# Patient Record
Sex: Female | Born: 1975 | Race: White | Hispanic: No | Marital: Married | Smoking: Never smoker
Health system: Southern US, Community
[De-identification: ages and names within clinical notes are randomized; demographics above are authoritative.]

---

## 2014-12-17 ENCOUNTER — Ambulatory Visit: Payer: Self-pay | Admitting: Obstetrics & Gynecology

## 2014-12-22 ENCOUNTER — Emergency Department: Payer: Self-pay | Admitting: Emergency Medicine

## 2014-12-30 ENCOUNTER — Emergency Department: Payer: Self-pay | Admitting: Emergency Medicine

## 2016-10-11 ENCOUNTER — Emergency Department

## 2016-10-11 ENCOUNTER — Emergency Department
Admission: EM | Admit: 2016-10-11 | Discharge: 2016-10-11 | Disposition: A | Discharge status: Home / Self Care | Attending: Emergency Medicine | Admitting: Emergency Medicine

## 2016-10-11 ENCOUNTER — Encounter: Payer: Self-pay | Admitting: Emergency Medicine

## 2016-10-11 DIAGNOSIS — R1110 Vomiting, unspecified: Secondary | ICD-10-CM | POA: Insufficient documentation

## 2016-10-11 DIAGNOSIS — R1011 Right upper quadrant pain: Secondary | ICD-10-CM | POA: Diagnosis not present

## 2016-10-11 LAB — URINALYSIS COMPLETE WITH MICROSCOPIC (ARMC ONLY)
Bacteria, UA: NONE SEEN
Bilirubin Urine: NEGATIVE
Glucose, UA: NEGATIVE mg/dL
Nitrite: NEGATIVE
PH: 5 (ref 5.0–8.0)
Protein, ur: 30 mg/dL — AB
SPECIFIC GRAVITY, URINE: 1.031 — AB (ref 1.005–1.030)

## 2016-10-11 LAB — COMPREHENSIVE METABOLIC PANEL
ALT: 16 U/L (ref 14–54)
ANION GAP: 9 (ref 5–15)
AST: 18 U/L (ref 15–41)
Albumin: 4 g/dL (ref 3.5–5.0)
Alkaline Phosphatase: 59 U/L (ref 38–126)
BUN: 11 mg/dL (ref 6–20)
CHLORIDE: 101 mmol/L (ref 101–111)
CO2: 26 mmol/L (ref 22–32)
Calcium: 8.8 mg/dL — ABNORMAL LOW (ref 8.9–10.3)
Creatinine, Ser: 0.61 mg/dL (ref 0.44–1.00)
GFR calc non Af Amer: >60 mL/min (ref >60)
Glucose, Bld: 144 mg/dL — ABNORMAL HIGH (ref 65–99)
POTASSIUM: 3.4 mmol/L — AB (ref 3.5–5.1)
SODIUM: 136 mmol/L (ref 135–145)
Total Bilirubin: 1 mg/dL (ref 0.3–1.2)
Total Protein: 7.4 g/dL (ref 6.5–8.1)

## 2016-10-11 LAB — CBC
HEMATOCRIT: 34.8 % — AB (ref 35.0–47.0)
HEMOGLOBIN: 12 g/dL (ref 12.0–16.0)
MCH: 30.6 pg (ref 26.0–34.0)
MCHC: 34.4 g/dL (ref 32.0–36.0)
MCV: 88.7 fL (ref 80.0–100.0)
Platelets: 230 10*3/uL (ref 150–440)
RBC: 3.92 MIL/uL (ref 3.80–5.20)
RDW: 12.9 % (ref 11.5–14.5)
WBC: 11.1 10*3/uL — ABNORMAL HIGH (ref 3.6–11.0)

## 2016-10-11 LAB — POCT PREGNANCY, URINE: PREG TEST UR: NEGATIVE

## 2016-10-11 LAB — LIPASE, BLOOD: Lipase: 33 U/L (ref 11–51)

## 2016-10-11 MED ORDER — ONDANSETRON 4 MG PO TBDP
ORAL_TABLET | ORAL | Status: AC
  Administered 2016-10-11: 4 mg via ORAL
  Filled 2016-10-11: qty 1

## 2016-10-11 MED ORDER — ONDANSETRON 4 MG PO TBDP: 4.0000 mg | ORAL_TABLET | Freq: Three times a day (TID) | ORAL | 0 refills | Status: AC | PRN

## 2016-10-11 MED ORDER — SODIUM CHLORIDE 0.9 % IV BOLUS (SEPSIS)
1000.0000 mL | Freq: Once | INTRAVENOUS | Status: AC
  Administered 2016-10-11: 1000 mL via INTRAVENOUS

## 2016-10-11 MED ORDER — MORPHINE SULFATE (PF) 2 MG/ML IV SOLN
2.0000 mg | Freq: Once | INTRAVENOUS | Status: DC
  Filled 2016-10-11: qty 1

## 2016-10-11 MED ORDER — IOPAMIDOL (ISOVUE-300) INJECTION 61%
100.0000 mL | Freq: Once | INTRAVENOUS | Status: AC | PRN
Start: 2016-10-11 — End: 2016-10-11
  Administered 2016-10-11: 100 mL via INTRAVENOUS

## 2016-10-11 MED ORDER — LOPERAMIDE HCL 2 MG PO TABS: 4.0000 mg | ORAL_TABLET | Freq: Four times a day (QID) | ORAL | 0 refills | Status: AC | PRN

## 2016-10-11 MED ORDER — ONDANSETRON 4 MG PO TBDP
4.0000 mg | ORAL_TABLET | Freq: Once | ORAL | Status: AC
  Administered 2016-10-11: 4 mg via ORAL

## 2016-10-11 MED ORDER — ONDANSETRON HCL 4 MG/2ML IJ SOLN
4.0000 mg | Freq: Once | INTRAMUSCULAR | Status: AC
  Administered 2016-10-11: 4 mg via INTRAVENOUS
  Filled 2016-10-11: qty 2

## 2016-10-11 MED ORDER — IOPAMIDOL (ISOVUE-300) INJECTION 61%
30.0000 mL | Freq: Once | INTRAVENOUS | Status: AC | PRN
  Administered 2016-10-11: 30 mL via ORAL

## 2016-10-11 NOTE — ED Provider Notes (Signed)
Patient feeling better. Tolerating oral intake. Exam benign. Vital signs unremarkable. CT negative. Follow-up primary care. Imodium and Zofran.   Sharman CheekPhillip Vianka Ertel, MD 10/11/16 [phone]

## 2016-10-11 NOTE — ED Provider Notes (Signed)
[facility] Emergency Department Provider Note    First MD Initiated Contact with Patient 10/11/16 [phone]     (approximate)  I have reviewed the triage vital signs and the nursing notes.   HISTORY  Chief Complaint Emesis    HPI Shelly HarmsLaura A Rotondo is a 40 y.o. female presents to emergency department with generalized abdominal pain and vomiting since yesterday. Patient admits to multiple episodes of nonbloody emesis. Patient denies any fever or febrile presentation with a temperature 98.6.   Past medical history None There are no active problems to display for this patient.   Past surgical history None  Prior to Admission medications   Medication Sig Start Date End Date Taking? Authorizing Provider  acetaminophen (TYLENOL) 325 MG tablet Take 650 mg by mouth every 6 (six) hours as needed.   Yes Historical Provider, MD  ibuprofen (ADVIL,MOTRIN) 200 MG tablet Take 200 mg by mouth every 6 (six) hours as needed.   Yes Historical Provider, MD  loratadine (CLARITIN) 10 MG tablet Take 10 mg by mouth daily.   Yes Historical Provider, MD    Allergies No known Drug allergies No family history on file.  Social History Social History  Substance Use Topics  . Smoking status: Never Smoker  . Smokeless tobacco: Never Used  . Alcohol use Not on file    Review of Systems Constitutional: No fever/chills Eyes: No visual changes. ENT: No sore throat. Cardiovascular: Denies chest pain. Respiratory: Denies shortness of breath. Gastrointestinal: Positive for vomiting and abdominal pain  Genitourinary: Negative for dysuria. Musculoskeletal: Negative for back pain. Skin: Negative for rash. Neurological: Negative for headaches, focal weakness or numbness.  10-point ROS otherwise negative.  ____________________________________________   PHYSICAL EXAM:  VITAL SIGNS: ED Triage Vitals  Enc Vitals Group     BP 10/11/16 0157 (!) 142/100     Pulse Rate 10/11/16  0157 (!) 103     Resp 10/11/16 0157 20     Temp 10/11/16 0157 98.6 F (37 C)     Temp Source 10/11/16 0157 Oral     SpO2 10/11/16 0157 100 %     Weight 10/11/16 0156 142 lb (64.4 kg)     Height 10/11/16 0156 5\' 8"  (1.727 m)     Head Circumference --      Peak Flow --      Pain Score --      Pain Loc --      Pain Edu? --      Excl. in GC? --     Constitutional: Alert and oriented. Well appearing and in no acute distress. Eyes: Conjunctivae are normal. PERRL. EOMI. Head: Atraumatic. Ears:  Healthy appearing ear canals and TMs bilaterally Nose: No congestion/rhinnorhea. Mouth/Throat: Mucous membranes are moist.  Oropharynx non-erythematous. Neck: No stridor.  No cervical spine tenderness to palpation. Cardiovascular: Normal rate, regular rhythm. Good peripheral circulation. Grossly normal heart sounds. Respiratory: Normal respiratory effort.  No retractions. Lungs CTAB. Gastrointestinal:Generalized tenderness to palpation worse in the right upper quadrant No distention.  Musculoskeletal: No lower extremity tenderness nor edema. No gross deformities of extremities. Neurologic:  Normal speech and language. No gross focal neurologic deficits are appreciated.  Skin:  Skin is warm, dry and intact. No rash noted. Psychiatric: Mood and affect are normal. Speech and behavior are normal.  ____________________________________________   LABS (all labs ordered are listed, but only abnormal results are displayed)  Labs Reviewed  COMPREHENSIVE METABOLIC PANEL - Abnormal; Notable for the following:  Result Value   Potassium 3.4 (*)    Glucose, Bld 144 (*)    Calcium 8.8 (*)    All other components within normal limits  CBC - Abnormal; Notable for the following:    WBC 11.1 (*)    HCT 34.8 (*)    All other components within normal limits  URINALYSIS COMPLETEWITH MICROSCOPIC ([facility] ONLY) - Abnormal; Notable for the following:    Color, Urine YELLOW (*)    APPearance HAZY (*)     Ketones, ur TRACE (*)    Specific Gravity, Urine 1.031 (*)    Hgb urine dipstick 1+ (*)    Protein, ur 30 (*)    Leukocytes, UA TRACE (*)    Squamous Epithelial / LPF 6-30 (*)    All other components within normal limits  LIPASE, BLOOD  POCT PREGNANCY, URINE    RADIOLOGY I, Lucan N Denetta Fei, personally viewed and evaluated these images (plain radiographs) as part of my medical decision making, as well as reviewing the written report by the radiologist.  [place] Abdomen Limited Ruq  Result Date: 10/11/2016 CLINICAL DATA:  Right upper quadrant pain and vomiting for 2 days EXAM: US ABDOMEN LIMITED - RIGHT UPPER QUADRANT COMPARISON:  None. FINDINGS: Gallbladder: No gallstones or wall thickening visualized. No sonographic Murphy sign noted by sonographer. Common bile duct: Diameter: 3.1 mm Liver: No focal lesion identified. Within normal limits in parenchymal echogenicity. IMPRESSION: Normal liver, gallbladder and bile ducts Electronically Signed   By: Ellery Plunkaniel R Mitchell M.D.   On: 10/11/2016 06:27     Procedures     INITIAL IMPRESSION / ASSESSMENT AND PLAN / ED COURSE  Pertinent labs & imaging results that were available during my care of the patient were reviewed by me and considered in my medical decision making (see chart for details).  Patient's care transferred to Dr. Scotty CourtStafford   Clinical Course    ____________________________________________  FINAL CLINICAL IMPRESSION(S) / ED DIAGNOSES  Final diagnoses:  Vomiting  RUQ pain     MEDICATIONS GIVEN DURING THIS VISIT:  Medications  morphine 2 MG/ML injection 2 mg (0 mg Intravenous Hold 10/11/16 0605)  ondansetron (ZOFRAN-ODT) disintegrating tablet 4 mg (4 mg Oral Given 10/11/16 0200)  sodium chloride 0.9 % bolus 1,000 mL (0 mLs Intravenous Stopped 10/11/16 0558)  ondansetron (ZOFRAN) injection 4 mg (4 mg Intravenous Given 10/11/16 0555)  sodium chloride 0.9 % bolus 1,000 mL (1,000 mLs Intravenous New Bag/Given 10/11/16 0558)      NEW OUTPATIENT MEDICATIONS STARTED DURING THIS VISIT:  New Prescriptions   No medications on file    Modified Medications   No medications on file    Discontinued Medications   No medications on file     Note:  This document was prepared using Dragon voice recognition software and may include unintentional dictation errors.    Darci Currentandolph N Nhat Hearne, MD 10/11/16 [phone]

## 2016-10-11 NOTE — ED Triage Notes (Signed)
Pt to triage via w/c, appears pale; st since yesterday having N/V

## 2017-06-14 IMAGING — CT CT ABD-PELV W/ CM
2 of 4 series · 15 of 46 positions shown, 17 images · IV contrast (APPLIED)
Comparison: None

CLINICAL DATA: Abdominal pain, nausea, and vomiting since yesterday

EXAM:
CT ABDOMEN AND PELVIS WITH CONTRAST
TECHNIQUE: Multidetector CT imaging of the abdomen and pelvis was performed
using the standard protocol following bolus administration of
intravenous contrast. Sagittal and coronal MPR images reconstructed
from axial data set.
CONTRAST:  100mL [id number] IOPAMIDOL ([id number]) INJECTION 61% IV.
Dilute oral contrast.

[Series 2: axial st · axial · 0.60mm/px · z∈[-459,-54]mm · 12 of 93 slices shown, 14 images]
[im 8/93  soft-tissue]
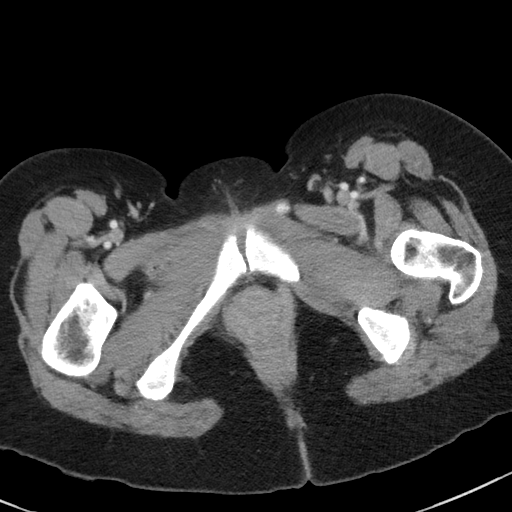
[im 8/93  bone]
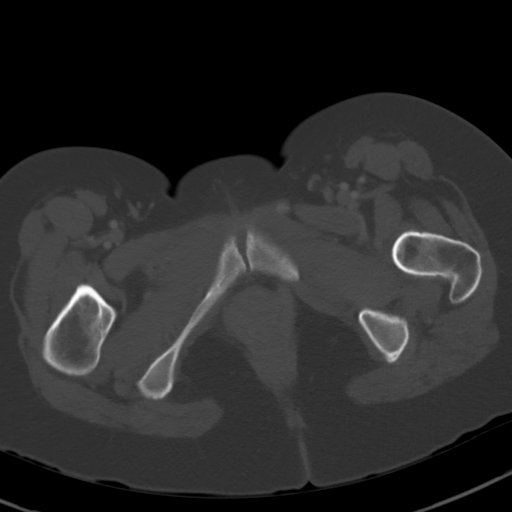
[im 15/93  soft-tissue]
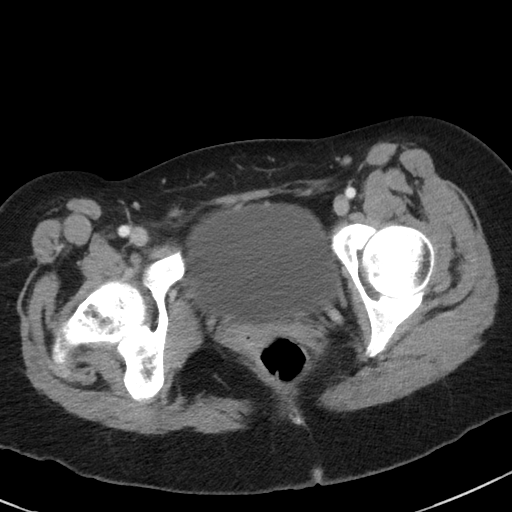
[im 23/93  soft-tissue]
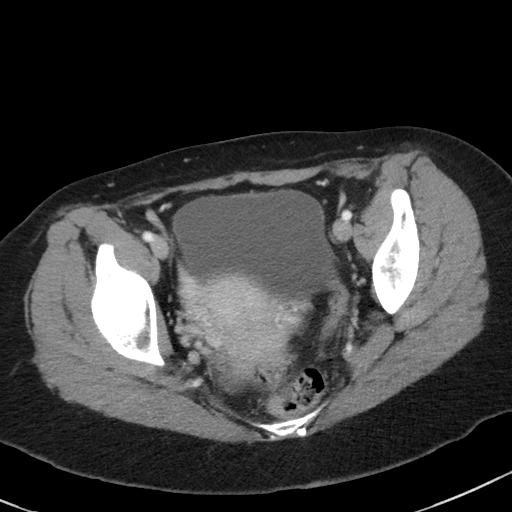
[im 30/93  soft-tissue]
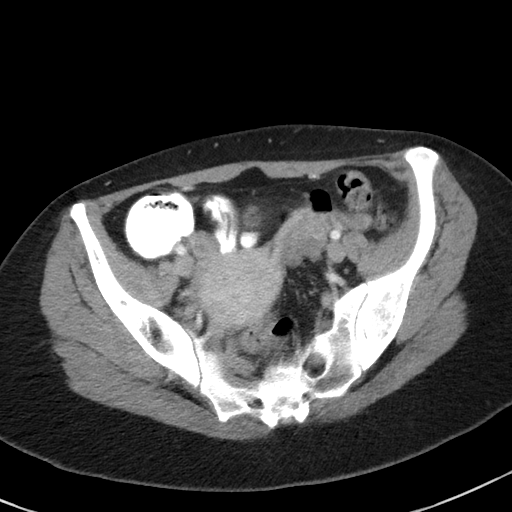
[im 37/93  soft-tissue]
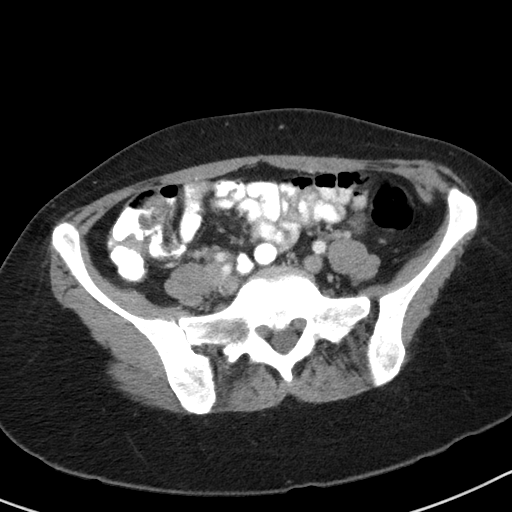
[im 45/93  soft-tissue]
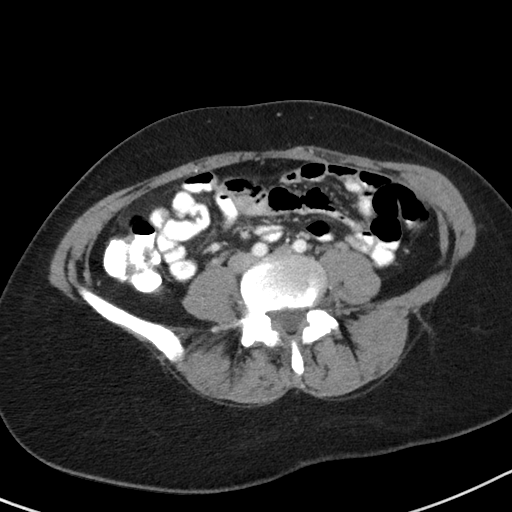
[im 52/93  soft-tissue]
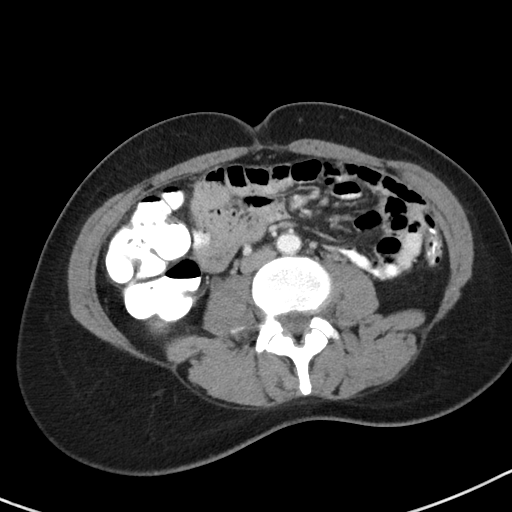
[im 59/93  soft-tissue]
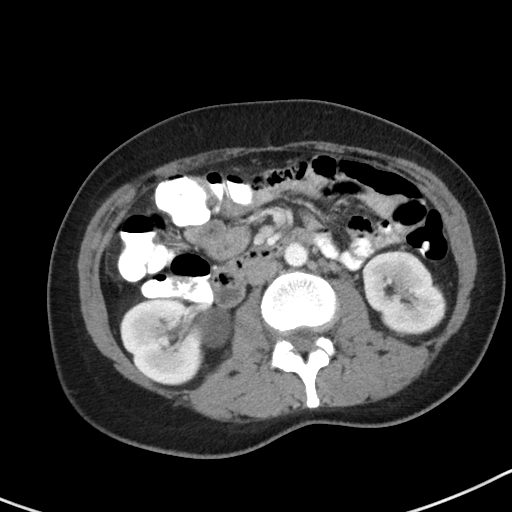
[im 67/93  soft-tissue]
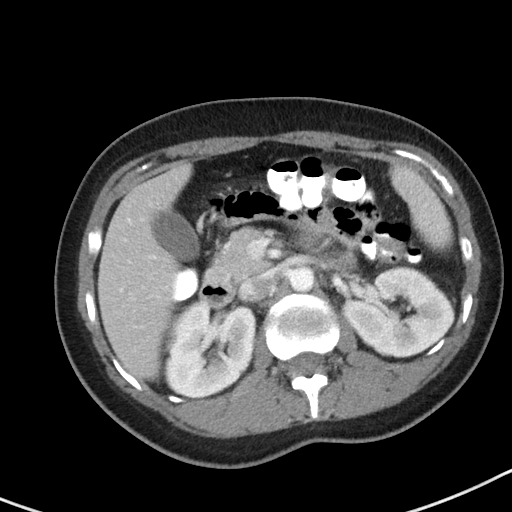
[im 67/93  bone]
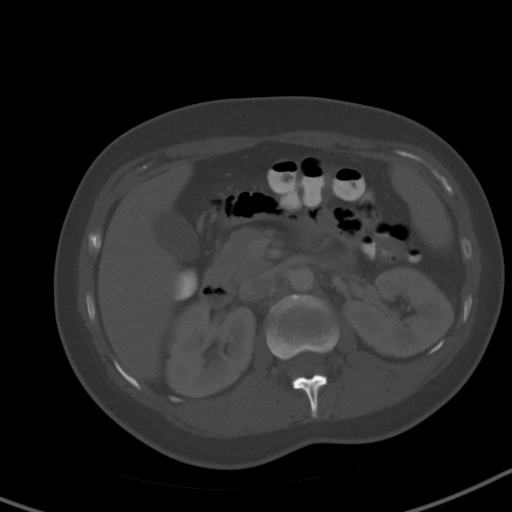
[im 74/93  soft-tissue]
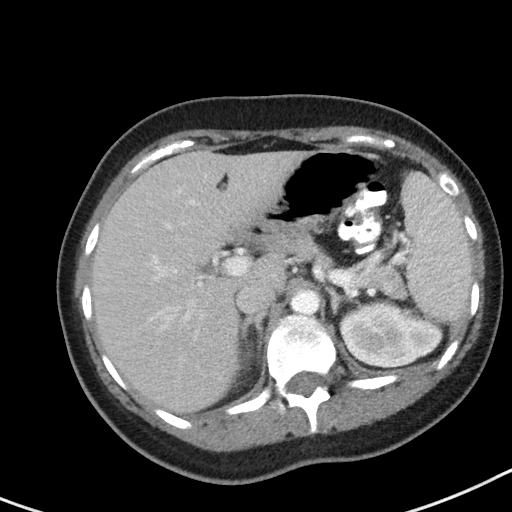
[im 81/93  soft-tissue]
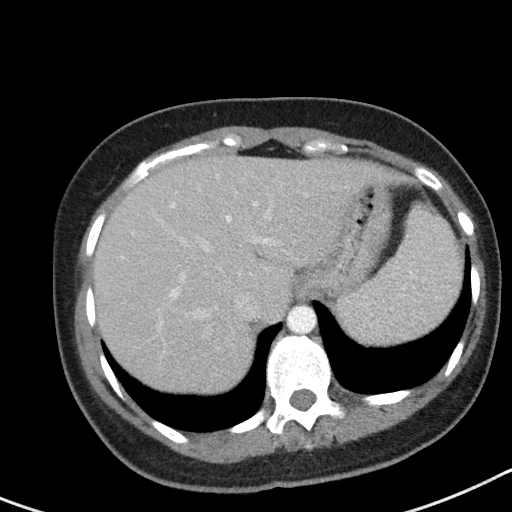
[im 89/93  soft-tissue]
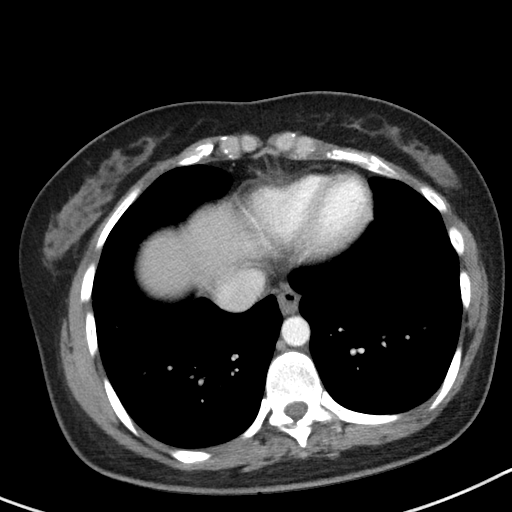

[Series 4: coronal st · coronal · 0.57mm/px · 3 of 75 slices shown]
[im 25/75  soft-tissue]
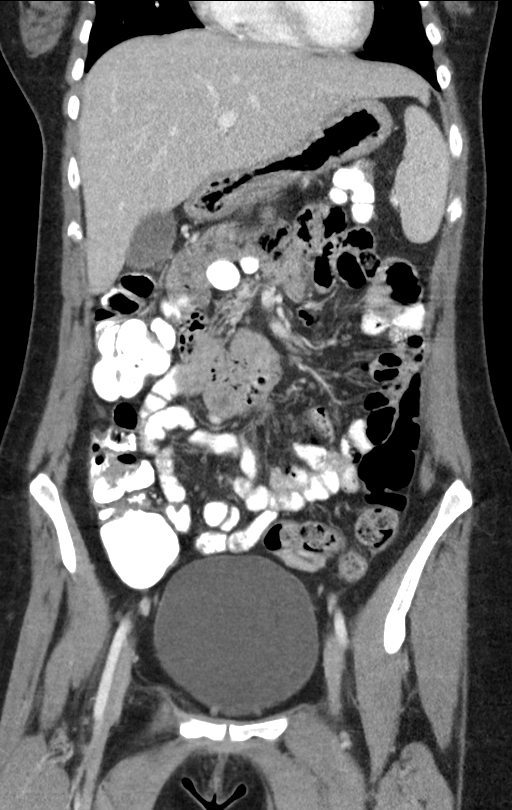
[im 33/75  soft-tissue]
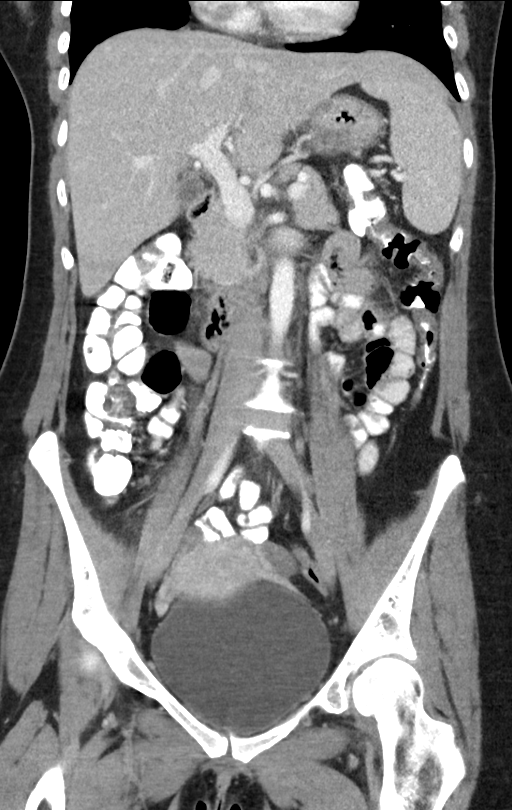
[im 42/75  soft-tissue]
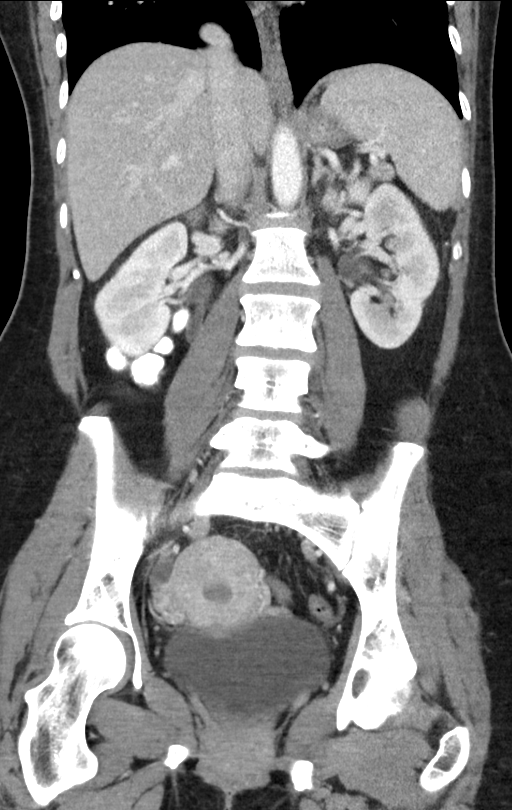

[15 of 46 positions shown; findings below may reference images not displayed]

FINDINGS: Lower chest: Calcified granulomata LEFT lung base images 6 & 16
in. Lung bases otherwise clear.

Hepatobiliary: Gallbladder and liver normal appearance

Pancreas: Normal appearance

Spleen: Normal appearance

Adrenals/Urinary Tract: Adrenal glands, kidneys, ureters, and
bladder normal appearance

Stomach/Bowel: Stomach and bowel loops normal appearance. Normal
appendix.

Vascular/Lymphatic: Aorta normal caliber.  No adenopathy.

Reproductive: Normal appearing uterus and LEFT ovary. Tiny RIGHT
ovarian cyst.

Other: Small amount of nonspecific low-attenuation free pelvic
fluid. No free air or definite inflammatory process. Tiny umbilical
hernia containing fat.

Musculoskeletal: Osseous structures unremarkable.
IMPRESSION: Old granulomatous disease LEFT lung base.

Small amount of nonspecific low-attenuation free pelvic fluid in cul
de sac with tiny RIGHT ovarian cyst.

No other intra-abdominal or intrapelvic abnormalities identified.
